# Patient Record
Sex: Female | Born: 1990 | Hispanic: No | Marital: Single | Smoking: Never smoker
Health system: Southern US, Community
[De-identification: ages and names within clinical notes are randomized; demographics above are authoritative.]

## PROBLEM LIST (undated history)

## (undated) DIAGNOSIS — M5430 Sciatica, unspecified side: Secondary | ICD-10-CM

## (undated) DIAGNOSIS — R569 Unspecified convulsions: Secondary | ICD-10-CM

## (undated) DIAGNOSIS — K219 Gastro-esophageal reflux disease without esophagitis: Secondary | ICD-10-CM

## (undated) HISTORY — PX: NO PAST SURGERIES: SHX2092

---

## 2016-02-11 ENCOUNTER — Emergency Department
Admission: EM | Admit: 2016-02-11 | Discharge: 2016-02-11 | Disposition: A | Discharge status: Home / Self Care | Payer: Medicaid Other | Attending: Emergency Medicine | Admitting: Emergency Medicine

## 2016-02-11 ENCOUNTER — Emergency Department: Payer: Medicaid Other

## 2016-02-11 ENCOUNTER — Encounter: Payer: Self-pay | Admitting: Emergency Medicine

## 2016-02-11 DIAGNOSIS — M25552 Pain in left hip: Secondary | ICD-10-CM | POA: Diagnosis not present

## 2016-02-11 DIAGNOSIS — M545 Low back pain: Secondary | ICD-10-CM | POA: Diagnosis not present

## 2016-02-11 DIAGNOSIS — Y92410 Unspecified street and highway as the place of occurrence of the external cause: Secondary | ICD-10-CM | POA: Insufficient documentation

## 2016-02-11 DIAGNOSIS — Y999 Unspecified external cause status: Secondary | ICD-10-CM | POA: Insufficient documentation

## 2016-02-11 DIAGNOSIS — Y9389 Activity, other specified: Secondary | ICD-10-CM | POA: Insufficient documentation

## 2016-02-11 DIAGNOSIS — M25551 Pain in right hip: Secondary | ICD-10-CM | POA: Diagnosis not present

## 2016-02-11 DIAGNOSIS — R102 Pelvic and perineal pain: Secondary | ICD-10-CM | POA: Diagnosis not present

## 2016-02-11 LAB — POCT PREGNANCY, URINE: Preg Test, Ur: NEGATIVE

## 2016-02-11 MED ORDER — ORPHENADRINE CITRATE 30 MG/ML IJ SOLN
60.0000 mg | Freq: Two times a day (BID) | INTRAMUSCULAR | Status: DC
  Administered 2016-02-11: 60 mg via INTRAMUSCULAR
  Filled 2016-02-11: qty 2

## 2016-02-11 MED ORDER — METHOCARBAMOL 750 MG PO TABS: 750.0000 mg | ORAL_TABLET | Freq: Four times a day (QID) | ORAL | 0 refills | Status: AC

## 2016-02-11 MED ORDER — KETOROLAC TROMETHAMINE 30 MG/ML IJ SOLN
30.0000 mg | Freq: Once | INTRAMUSCULAR | Status: AC
  Administered 2016-02-11: 30 mg via INTRAVENOUS
  Filled 2016-02-11: qty 1

## 2016-02-11 MED ORDER — HYDROMORPHONE HCL 1 MG/ML IJ SOLN
1.0000 mg | Freq: Once | INTRAMUSCULAR | Status: AC
  Administered 2016-02-11: 1 mg via INTRAMUSCULAR
  Filled 2016-02-11: qty 1

## 2016-02-11 MED ORDER — TRAMADOL HCL 50 MG PO TABS: 50.0000 mg | ORAL_TABLET | Freq: Four times a day (QID) | ORAL | 0 refills | Status: DC | PRN

## 2016-02-11 MED ORDER — IBUPROFEN 600 MG PO TABS: 600.0000 mg | ORAL_TABLET | Freq: Three times a day (TID) | ORAL | 0 refills | Status: DC | PRN

## 2016-02-11 NOTE — ED Notes (Signed)
Back from x-ray  Family at bedside  
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
## 2016-11-25 ENCOUNTER — Other Ambulatory Visit: Payer: Self-pay | Admitting: Obstetrics & Gynecology

## 2016-11-25 ENCOUNTER — Ambulatory Visit
Admission: RE | Admit: 2016-11-25 | Discharge: 2016-11-25 | Disposition: A | Discharge status: Home / Self Care | Payer: Medicaid Other | Source: Ambulatory Visit | Attending: Obstetrics & Gynecology | Admitting: Obstetrics & Gynecology

## 2016-11-25 DIAGNOSIS — D271 Benign neoplasm of left ovary: Secondary | ICD-10-CM | POA: Insufficient documentation

## 2016-11-25 DIAGNOSIS — R1900 Intra-abdominal and pelvic swelling, mass and lump, unspecified site: Secondary | ICD-10-CM

## 2016-11-25 MED ORDER — IOPAMIDOL (ISOVUE-300) INJECTION 61%
100.0000 mL | Freq: Once | INTRAVENOUS | Status: AC | PRN
  Administered 2016-11-25: 100 mL via INTRAVENOUS

## 2016-12-16 ENCOUNTER — Inpatient Hospital Stay
Admission: RE | Admit: 2016-12-16 | Discharge: 2016-12-16 | Disposition: A | Discharge status: Home / Self Care | Payer: Medicaid Other | Source: Ambulatory Visit

## 2016-12-17 ENCOUNTER — Inpatient Hospital Stay: Admission: RE | Admit: 2016-12-17 | Payer: Medicaid Other | Source: Ambulatory Visit

## 2016-12-18 ENCOUNTER — Encounter: Payer: Self-pay | Admitting: *Deleted

## 2016-12-18 ENCOUNTER — Encounter
Admission: RE | Admit: 2016-12-18 | Discharge: 2016-12-18 | Disposition: A | Discharge status: Home / Self Care | Payer: Medicaid Other | Source: Ambulatory Visit | Attending: Obstetrics & Gynecology | Admitting: Obstetrics & Gynecology

## 2016-12-18 ENCOUNTER — Other Ambulatory Visit: Payer: Self-pay

## 2016-12-18 HISTORY — DX: Gastro-esophageal reflux disease without esophagitis: K21.9

## 2016-12-18 HISTORY — DX: Sciatica, unspecified side: M54.30

## 2016-12-18 HISTORY — DX: Unspecified convulsions: R56.9

## 2016-12-18 NOTE — Patient Instructions (Signed)
  Your procedure is scheduled on: 12-26-16 FRIDAY Report to Same Day Surgery 2nd floor medical mall [facility] Entrance-take elevator on left to 2nd floor.  Check in with surgery information desk.) To find out your arrival time please call [phone] between 1PM - 3PM on 12-25-16 THURSDAY  Remember: Instructions that are not followed completely may result in serious medical risk, up to and including death, or upon the discretion of your surgeon and anesthesiologist your surgery may need to be rescheduled.    _x___ 1. Do not eat food after midnight the night before your procedure. NO GUM OR CANDY AFTER MIDNIGHT.  You may drink clear liquids up to 2 hours before you are scheduled to arrive at the hospital for your procedure.  Do not drink clear liquids within 2 hours of your scheduled arrival to the hospital.  Clear liquids include  --Water or Apple juice without pulp  --Clear carbohydrate beverage such as ClearFast or Gatorade  --Black Coffee or Clear Tea (No milk, no creamers, do not add anything to the coffee or Tea     __x__ 2. No Alcohol for 24 hours before or after surgery.   __x__3. No Smoking for 24 prior to surgery.   ____  4. Bring all medications with you on the day of surgery if instructed.    __x__ 5. Notify your doctor if there is any change in your medical condition     (cold, fever, infections).     Do not wear jewelry, make-up, hairpins, clips or nail polish.  Do not wear lotions, powders, or perfumes. You may wear deodorant.  Do not shave 48 hours prior to surgery. Men may shave face and neck.  Do not bring valuables to the hospital.    [facility] is not responsible for any belongings or valuables.               Contacts, dentures or bridgework may not be worn into surgery.  Leave your suitcase in the car. After surgery it may be brought to your room.  For patients admitted to the hospital, discharge time is determined by your treatment team.   Patients  discharged the day of surgery will not be allowed to drive home.  You will need someone to drive you home and stay with you the night of your procedure.    ____ Take anti-hypertensive listed below, cardiac, seizure, asthma,  anti-reflux and psychiatric medicines. These include:  1. NONE  2.  3.  4.  5.  6.  ____Fleets enema or Magnesium Citrate as directed.   _x___ Use CHG Soap or sage wipes as directed on instruction sheet   ____ Use inhalers on the day of surgery and bring to hospital day of surgery  ____ Stop Metformin and Janumet 2 days prior to surgery.    ____ Take 1/2 of usual insulin dose the night before surgery and none on the morning surgery.   ____ Follow recommendations from Cardiologist, Pulmonologist or PCP regarding stopping Aspirin, Coumadin, Plavix ,Eliquis, Effient, or Pradaxa, and Pletal.  X____Stop Anti-inflammatories such as Advil, Aleve, IBUPROFEN, Motrin, Naproxen, Naprosyn, Goodies powders or aspirin products NOW- OK to take Tylenol    ____ Stop supplements until after surgery.     ____ Bring C-Pap to the hospital.

## 2016-12-22 ENCOUNTER — Encounter
Admission: RE | Admit: 2016-12-22 | Discharge: 2016-12-22 | Disposition: A | Discharge status: Home / Self Care | Payer: Medicaid Other | Source: Ambulatory Visit | Attending: Obstetrics & Gynecology | Admitting: Obstetrics & Gynecology

## 2016-12-22 DIAGNOSIS — Z01812 Encounter for preprocedural laboratory examination: Secondary | ICD-10-CM | POA: Insufficient documentation

## 2016-12-22 LAB — TYPE AND SCREEN
ABO/RH(D): A POS
ANTIBODY SCREEN: NEGATIVE

## 2016-12-22 LAB — CBC
HEMATOCRIT: 39.4 % (ref 35.0–47.0)
HEMOGLOBIN: 13.3 g/dL (ref 12.0–16.0)
MCH: 30.4 pg (ref 26.0–34.0)
MCHC: 33.7 g/dL (ref 32.0–36.0)
MCV: 90.3 fL (ref 80.0–100.0)
Platelets: 160 10*3/uL (ref 150–440)
RBC: 4.36 MIL/uL (ref 3.80–5.20)
RDW: 13.1 % (ref 11.5–14.5)
WBC: 7.2 10*3/uL (ref 3.6–11.0)

## 2016-12-22 LAB — BASIC METABOLIC PANEL
ANION GAP: 6 (ref 5–15)
BUN: 15 mg/dL (ref 6–20)
CO2: 27 mmol/L (ref 22–32)
Calcium: 9 mg/dL (ref 8.9–10.3)
Chloride: 105 mmol/L (ref 101–111)
Creatinine, Ser: 0.74 mg/dL (ref 0.44–1.00)
GFR calc Af Amer: >60 mL/min (ref >60)
Glucose, Bld: 86 mg/dL (ref 65–99)
POTASSIUM: 3.4 mmol/L — AB (ref 3.5–5.1)
SODIUM: 138 mmol/L (ref 135–145)

## 2016-12-22 NOTE — Patient Instructions (Addendum)
  Your procedure is scheduled XQ:JJHERD Dec. 21 , 2018. Report to Same Day Surgery. To find out your arrival time please call [phone] between 1PM - 3PM on Thursday Dec. 20th, 2018.  Remember: Instructions that are not followed completely may result in serious medical risk, up to and including death, or upon the discretion of your surgeon and anesthesiologist your surgery may need to be rescheduled.    _x___ 1. Do not eat food after midnight night prior to surgery.     No gum chewing or hard candies, snacks or breakfast.     May drink the following:      water      Gatorade     clear apple juice      black coffee      or black tea      ____ 2. No Alcohol for 24 hours before or after surgery.   ____ 3. Bring all medications with you on the day of surgery if instructed.    __x__ 4. Notify your doctor if there is any change in your medical condition     (cold, fever, infections).    _____ 5.   Do Not Smoke or use e-cigarettes For 24 Hours Prior to Your   Surgery.  Do not use any chewable tobacco products for at least 6   hours prior to  surgery.                  Do not wear jewelry, make-up, hairpins, clips or nail polish.  Do not wear lotions, powders, or perfumes.   Do not shave 48 hours prior to surgery. Men may shave face and neck.  Do not bring valuables to the hospital.    [facility] is not responsible for any belongings or valuables.               Contacts, dentures or bridgework may not be worn into surgery.  Leave your suitcase in the car. After surgery it may be brought to your room.  For patients admitted to the hospital, discharge time is determined by your  treatment team.   Patients discharged the day of surgery will not be allowed to drive home.    Please read over the following fact sheets that you were given:   [facility] Preparing for Surgery             ____ Take these medicines the morning of surgery with A SIP OF WATER:  NONE       ____ Fleet Enema (as directed)   __x__ Use CHG Soap as directed on instruction sheet  ____ Use inhalers on the day of surgery and bring to hospital day of surgery  ____ Stop metformin 2 days prior to surgery    ____ Take 1/2 of usual insulin dose the night before surgery and none on the morning of          surgery.   ____ Stop Eliquis/Coumadin/Plavix/aspirin on does not apply.  __x_ Stop Anti-inflammatories such as Advil, Aleve, Ibuprofen, Motrin, Naproxen,  Naprosyn, Goodies powders or aspirin products. OK to take Tylenol.   ____ Stop supplements until after surgery.    ____ Bring C-Pap to the hospital.

## 2016-12-25 ENCOUNTER — Encounter: Payer: Self-pay | Admitting: *Deleted

## 2016-12-26 ENCOUNTER — Ambulatory Visit
Admission: RE | Admit: 2016-12-26 | Discharge: 2016-12-26 | Disposition: A | Discharge status: Home / Self Care | Payer: Medicaid Other | Source: Ambulatory Visit | Attending: Obstetrics & Gynecology | Admitting: Obstetrics & Gynecology

## 2016-12-26 ENCOUNTER — Ambulatory Visit: Payer: Medicaid Other | Admitting: Anesthesiology

## 2016-12-26 ENCOUNTER — Encounter
Admission: RE | Disposition: A | Discharge status: Home / Self Care | Payer: Self-pay | Source: Ambulatory Visit | Attending: Obstetrics & Gynecology

## 2016-12-26 DIAGNOSIS — K219 Gastro-esophageal reflux disease without esophagitis: Secondary | ICD-10-CM | POA: Insufficient documentation

## 2016-12-26 DIAGNOSIS — Z79899 Other long term (current) drug therapy: Secondary | ICD-10-CM | POA: Insufficient documentation

## 2016-12-26 DIAGNOSIS — N83202 Unspecified ovarian cyst, left side: Secondary | ICD-10-CM | POA: Diagnosis present

## 2016-12-26 DIAGNOSIS — D271 Benign neoplasm of left ovary: Secondary | ICD-10-CM | POA: Insufficient documentation

## 2016-12-26 LAB — URINE DRUG SCREEN, QUALITATIVE (ARMC ONLY)
AMPHETAMINES, UR SCREEN: NOT DETECTED
BENZODIAZEPINE, UR SCRN: NOT DETECTED
Barbiturates, Ur Screen: NOT DETECTED
CANNABINOID 50 NG, UR ~~LOC~~: POSITIVE — AB
Cocaine Metabolite,Ur ~~LOC~~: NOT DETECTED
MDMA (Ecstasy)Ur Screen: NOT DETECTED
Methadone Scn, Ur: NOT DETECTED
OPIATE, UR SCREEN: NOT DETECTED
PHENCYCLIDINE (PCP) UR S: NOT DETECTED
Tricyclic, Ur Screen: NOT DETECTED

## 2016-12-26 LAB — POCT PREGNANCY, URINE: Preg Test, Ur: NEGATIVE

## 2016-12-26 SURGERY — OOPHORECTOMY, LAPAROSCOPIC
Anesthesia: General | Laterality: Left | Wound class: Clean

## 2016-12-26 MED ORDER — FENTANYL CITRATE (PF) 100 MCG/2ML IJ SOLN
INTRAMUSCULAR | Status: DC | PRN
  Administered 2016-12-26: 100 ug via INTRAVENOUS
  Administered 2016-12-26: 150 ug via INTRAVENOUS

## 2016-12-26 MED ORDER — FAMOTIDINE 20 MG PO TABS
ORAL_TABLET | ORAL | Status: AC
  Administered 2016-12-26: 20 mg via ORAL
  Filled 2016-12-26: qty 1

## 2016-12-26 MED ORDER — LACTATED RINGERS IV SOLN
INTRAVENOUS | Status: DC
  Administered 2016-12-26 (×2): via INTRAVENOUS

## 2016-12-26 MED ORDER — OXYCODONE HCL 5 MG PO TABS: 5.0000 mg | ORAL_TABLET | ORAL | 0 refills | Status: AC | PRN

## 2016-12-26 MED ORDER — FENTANYL CITRATE (PF) 100 MCG/2ML IJ SOLN: 25.0000 ug | INTRAMUSCULAR | Status: DC | PRN

## 2016-12-26 MED ORDER — FENTANYL CITRATE (PF) 250 MCG/5ML IJ SOLN
INTRAMUSCULAR | Status: AC
  Filled 2016-12-26: qty 5

## 2016-12-26 MED ORDER — SUCCINYLCHOLINE CHLORIDE 20 MG/ML IJ SOLN
INTRAMUSCULAR | Status: AC
  Filled 2016-12-26: qty 1

## 2016-12-26 MED ORDER — OXYCODONE HCL 5 MG PO TABS
5.0000 mg | ORAL_TABLET | Freq: Once | ORAL | Status: AC | PRN
  Administered 2016-12-26: 5 mg via ORAL

## 2016-12-26 MED ORDER — ROCURONIUM BROMIDE 100 MG/10ML IV SOLN
INTRAVENOUS | Status: DC | PRN
  Administered 2016-12-26: 40 mg via INTRAVENOUS

## 2016-12-26 MED ORDER — OXYCODONE HCL 5 MG/5ML PO SOLN: 5.0000 mg | Freq: Once | ORAL | Status: AC | PRN

## 2016-12-26 MED ORDER — CELECOXIB 200 MG PO CAPS
ORAL_CAPSULE | ORAL | Status: AC
  Administered 2016-12-26: 400 mg via ORAL
  Filled 2016-12-26: qty 2

## 2016-12-26 MED ORDER — KETOROLAC TROMETHAMINE 30 MG/ML IJ SOLN
30.0000 mg | Freq: Once | INTRAMUSCULAR | Status: AC
  Administered 2016-12-26: 30 mg via INTRAVENOUS

## 2016-12-26 MED ORDER — CELECOXIB 200 MG PO CAPS
400.0000 mg | ORAL_CAPSULE | Freq: Once | ORAL | Status: AC
  Administered 2016-12-26: 400 mg via ORAL

## 2016-12-26 MED ORDER — ACETAMINOPHEN 500 MG PO TABS
ORAL_TABLET | ORAL | Status: AC
  Administered 2016-12-26: 1000 mg via ORAL
  Filled 2016-12-26: qty 2

## 2016-12-26 MED ORDER — FAMOTIDINE 20 MG PO TABS
20.0000 mg | ORAL_TABLET | Freq: Once | ORAL | Status: AC
  Administered 2016-12-26: 20 mg via ORAL

## 2016-12-26 MED ORDER — ACETAMINOPHEN 500 MG PO TABS
1000.0000 mg | ORAL_TABLET | Freq: Once | ORAL | Status: AC
  Administered 2016-12-26: 1000 mg via ORAL

## 2016-12-26 MED ORDER — DEXAMETHASONE SODIUM PHOSPHATE 10 MG/ML IJ SOLN
INTRAMUSCULAR | Status: DC | PRN
  Administered 2016-12-26: 10 mg via INTRAVENOUS

## 2016-12-26 MED ORDER — GABAPENTIN 600 MG PO TABS
600.0000 mg | ORAL_TABLET | Freq: Once | ORAL | Status: DC
  Filled 2016-12-26: qty 1

## 2016-12-26 MED ORDER — MIDAZOLAM HCL 2 MG/2ML IJ SOLN
INTRAMUSCULAR | Status: DC | PRN
  Administered 2016-12-26: 2 mg via INTRAVENOUS

## 2016-12-26 MED ORDER — PHENYLEPHRINE HCL 10 MG/ML IJ SOLN
INTRAMUSCULAR | Status: DC | PRN
  Administered 2016-12-26: 80 ug via INTRAVENOUS

## 2016-12-26 MED ORDER — HEPARIN SODIUM (PORCINE) 5000 UNIT/ML IJ SOLN
5000.0000 [IU] | Freq: Once | INTRAMUSCULAR | Status: AC
  Administered 2016-12-26: 5000 [IU] via SUBCUTANEOUS

## 2016-12-26 MED ORDER — MIDAZOLAM HCL 2 MG/2ML IJ SOLN
INTRAMUSCULAR | Status: AC
  Filled 2016-12-26: qty 2

## 2016-12-26 MED ORDER — OXYCODONE HCL 5 MG PO TABS
ORAL_TABLET | ORAL | Status: AC
  Administered 2016-12-26: 5 mg via ORAL
  Filled 2016-12-26: qty 1

## 2016-12-26 MED ORDER — ONDANSETRON HCL 4 MG/2ML IJ SOLN
INTRAMUSCULAR | Status: DC | PRN
  Administered 2016-12-26: 4 mg via INTRAVENOUS

## 2016-12-26 MED ORDER — HEPARIN SODIUM (PORCINE) 5000 UNIT/ML IJ SOLN
INTRAMUSCULAR | Status: AC
  Administered 2016-12-26: 5000 [IU] via SUBCUTANEOUS
  Filled 2016-12-26: qty 1

## 2016-12-26 MED ORDER — BUPIVACAINE LIPOSOME 1.3 % IJ SUSP
INTRAMUSCULAR | Status: AC
  Filled 2016-12-26: qty 20

## 2016-12-26 MED ORDER — PROPOFOL 10 MG/ML IV BOLUS
INTRAVENOUS | Status: AC
  Filled 2016-12-26: qty 20

## 2016-12-26 MED ORDER — DEXAMETHASONE SODIUM PHOSPHATE 10 MG/ML IJ SOLN
INTRAMUSCULAR | Status: AC
  Filled 2016-12-26: qty 1

## 2016-12-26 MED ORDER — SUGAMMADEX SODIUM 200 MG/2ML IV SOLN
INTRAVENOUS | Status: AC
Start: 2016-12-26 — End: ?
  Filled 2016-12-26: qty 2

## 2016-12-26 MED ORDER — IBUPROFEN 800 MG PO TABS: 800.0000 mg | ORAL_TABLET | Freq: Four times a day (QID) | ORAL | 1 refills | Status: AC | PRN

## 2016-12-26 MED ORDER — SEVOFLURANE IN SOLN
RESPIRATORY_TRACT | Status: AC
  Filled 2016-12-26: qty 250

## 2016-12-26 MED ORDER — BUPIVACAINE LIPOSOME 1.3 % IJ SUSP
INTRAMUSCULAR | Status: DC | PRN
  Administered 2016-12-26: 20 mL

## 2016-12-26 MED ORDER — ROCURONIUM BROMIDE 50 MG/5ML IV SOLN
INTRAVENOUS | Status: AC
  Filled 2016-12-26: qty 1

## 2016-12-26 MED ORDER — ONDANSETRON HCL 4 MG/2ML IJ SOLN
INTRAMUSCULAR | Status: AC
  Filled 2016-12-26: qty 2

## 2016-12-26 MED ORDER — GABAPENTIN 300 MG PO CAPS
ORAL_CAPSULE | ORAL | Status: AC
  Administered 2016-12-26: 600 mg
  Filled 2016-12-26: qty 2

## 2016-12-26 MED ORDER — LIDOCAINE HCL (PF) 2 % IJ SOLN
INTRAMUSCULAR | Status: AC
  Filled 2016-12-26: qty 10

## 2016-12-26 MED ORDER — KETOROLAC TROMETHAMINE 30 MG/ML IJ SOLN
INTRAMUSCULAR | Status: AC
  Administered 2016-12-26: 30 mg via INTRAVENOUS
  Filled 2016-12-26: qty 1

## 2016-12-26 MED ORDER — SUGAMMADEX SODIUM 200 MG/2ML IV SOLN
INTRAVENOUS | Status: DC | PRN
  Administered 2016-12-26: 200 mg via INTRAVENOUS

## 2016-12-26 MED ORDER — LIDOCAINE 2% (20 MG/ML) 5 ML SYRINGE
INTRAMUSCULAR | Status: DC | PRN
  Administered 2016-12-26: 100 mg via INTRAVENOUS

## 2016-12-26 MED ORDER — DEXMEDETOMIDINE HCL 200 MCG/2ML IV SOLN
INTRAVENOUS | Status: DC | PRN
  Administered 2016-12-26: 12 ug via INTRAVENOUS

## 2016-12-26 MED ORDER — PROPOFOL 10 MG/ML IV BOLUS
INTRAVENOUS | Status: DC | PRN
  Administered 2016-12-26: 200 mg via INTRAVENOUS

## 2016-12-26 SURGICAL SUPPLY — 39 items
BAG URINE DRAINAGE (UROLOGICAL SUPPLIES) ×3 IMPLANT
BLADE SURG SZ11 CARB STEEL (BLADE) ×3 IMPLANT
CANISTER SUCT 1200ML W/VALVE (MISCELLANEOUS) ×3 IMPLANT
CATH FOLEY 2WAY  5CC 16FR (CATHETERS) ×2
CATH URTH 16FR FL 2W BLN LF (CATHETERS) ×1 IMPLANT
CHLORAPREP W/TINT 26ML (MISCELLANEOUS) ×3 IMPLANT
DERMABOND ADVANCED (GAUZE/BANDAGES/DRESSINGS) ×2
DERMABOND ADVANCED .7 DNX12 (GAUZE/BANDAGES/DRESSINGS) ×1 IMPLANT
DRAPE LEGGINS SURG 28X43 STRL (DRAPES) ×3 IMPLANT
DRAPE UNDER BUTTOCK W/FLU (DRAPES) ×3 IMPLANT
GLOVE PI ORTHOPRO 6.5 (GLOVE) ×2
GLOVE PI ORTHOPRO STRL 6.5 (GLOVE) ×1 IMPLANT
GLOVE SURG SYN 6.5 ES PF (GLOVE) ×3 IMPLANT
GOWN STRL REUS W/ TWL LRG LVL3 (GOWN DISPOSABLE) ×2 IMPLANT
GOWN STRL REUS W/TWL LRG LVL3 (GOWN DISPOSABLE) ×4
GRASPER SUT TROCAR 14GX15 (MISCELLANEOUS) ×3 IMPLANT
IRRIGATION STRYKERFLOW (MISCELLANEOUS) ×1 IMPLANT
IRRIGATOR STRYKERFLOW (MISCELLANEOUS) ×3
IV LACTATED RINGERS 1000ML (IV SOLUTION) ×3 IMPLANT
KIT PINK PAD W/HEAD ARE REST (MISCELLANEOUS) ×3
KIT PINK PAD W/HEAD ARM REST (MISCELLANEOUS) ×1 IMPLANT
KIT RM TURNOVER CYSTO AR (KITS) ×3 IMPLANT
LABEL OR SOLS (LABEL) IMPLANT
LIGASURE VESSEL 5MM BLUNT TIP (ELECTROSURGICAL) ×3 IMPLANT
MANIPULATOR UTERINE 4.5 ZUMI (MISCELLANEOUS) IMPLANT
NEEDLE HYPO 22GX1.5 SAFETY (NEEDLE) ×3 IMPLANT
NS IRRIG 500ML POUR BTL (IV SOLUTION) ×3 IMPLANT
PACK LAP CHOLECYSTECTOMY (MISCELLANEOUS) ×3 IMPLANT
PAD OB MATERNITY 4.3X12.25 (PERSONAL CARE ITEMS) ×3 IMPLANT
PAD PREP 24X41 OB/GYN DISP (PERSONAL CARE ITEMS) ×3 IMPLANT
POUCH SPECIMEN RETRIEVAL 10MM (ENDOMECHANICALS) ×6 IMPLANT
SCISSORS METZENBAUM CVD 33 (INSTRUMENTS) IMPLANT
SLEEVE ENDOPATH XCEL 5M (ENDOMECHANICALS) ×3 IMPLANT
SUT MNCRL AB 4-0 PS2 18 (SUTURE) ×3 IMPLANT
SUT VIC AB 2-0 UR6 27 (SUTURE) ×3 IMPLANT
SYR 10ML LL (SYRINGE) ×3 IMPLANT
TROCAR ENDO BLADELESS 11MM (ENDOMECHANICALS) ×3 IMPLANT
TROCAR XCEL NON-BLD 5MMX100MML (ENDOMECHANICALS) ×3 IMPLANT
TUBING INSUFFLATION (TUBING) ×3 IMPLANT
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
## 2016-12-26 NOTE — Discharge Instructions (Addendum)
Discharge instructions:   Call office if you have any of the following: fever >101 F, chills, excessive vaginal bleeding, incision drainage or problems, leg pain or redness, or any other concerns.     Activity: Do not lift > 10 lbs for 8 weeks.  No driving for 1-2 weeks.     You may feel some pain in your upper right abdomen/rib and right shoulder.  This is from the gas in the abdomen for surgery. This will subside over time, please be patient!    Take 600mg  Ibuprofen and 1000mg  Tylenol around the clock, every 6 hours for at least the first 3-5 days.  After this you can take as needed.  This will help decrease inflammation and promote healing.  The narcotics you'll take just as needed, as they just trick your brain into thinking its not in pain.      Please don't limit yourself in terms of routine activity.  You will be able to do most things, although they may take longer to do or be a little painful.  You can do it!    Don't be a hero, but don't be a wimp either!             AMBULATORY SURGERY  DISCHARGE INSTRUCTIONS   1) The drugs that you were given will stay in your system until tomorrow so for the next 24 hours you should not:  A) Drive an automobile B) Make any legal decisions C) Drink any alcoholic beverage   2) You may resume regular meals tomorrow.  Today it is better to start with liquids and gradually work up to solid foods.  You may eat anything you prefer, but it is better to start with liquids, then soup and crackers, and gradually work up to solid foods.   3) Please notify your doctor immediately if you have any unusual bleeding, trouble breathing, redness and pain at the surgery site, drainage, fever, or pain not relieved by medication.    4) Additional Instructions: TAKE A STOOL SOFTENER TWICE A DAY WHILE TAKING NARCOTIC PAIN MEDICINE TO PREVENT CONSTIPATION   Please contact your physician with any problems or Same Day Surgery at  [phone], Monday through Friday 6 am to 4 pm, or [place] [facility] number at [phone].      CIRUGIA AMBULATORIA       Instruccionnes de alta    Date (Fecha)    1.  Las drogas que se Statistician en su cuerpo [employer], asi      que por las proximas 24 horas usted no debe:   Conducir Scientist, research (medical)) un automovil   Hacer ninguna decision legal   Tomar ninguna bebida alcoholica  2.  A) Manana puede comenzar una dieta regular.  Es mejor que hoy empiece con                    liquidos y gradualmente anada comidas solidas.       B) Puede comer cualquier comida que desee pero es mejor empezar con liquidos,               luego sopitas con galletas saladas y gradualmente llegar a las comidas solidas.   3.  Por favor avise a su medico inmediatamente si usted tiene algun sangrado anormal,       tiene dificultad con la respiracion, enrojecimiento y Social research officer, government en el sitio de la cirugia,     [place], fiebro o dolor que se [place] con  medicina.   4.  A) Su visita posoperatoria (despues de su operacion) es con el                           B)  Por favor llame para hacer la cita posoperatoria.  5.  Istrucciones especificas :

## 2016-12-26 NOTE — Anesthesia Post-op Follow-up Note (Signed)
Anesthesia QCDR form completed.        

## 2016-12-26 NOTE — Anesthesia Postprocedure Evaluation (Signed)
Anesthesia Post Note  Patient: Olivia Kennedy  Procedure(s) Performed: LAPAROSCOPIC OOPHORECTOMY WITH A MINI LAP (Left )  Patient location during evaluation: PACU Anesthesia Type: General Level of consciousness: awake and alert Pain management: pain level controlled Vital Signs Assessment: post-procedure vital signs reviewed and stable Respiratory status: spontaneous breathing, nonlabored ventilation, respiratory function stable and patient connected to nasal cannula oxygen Cardiovascular status: blood pressure returned to baseline and stable Postop Assessment: no apparent nausea or vomiting Anesthetic complications: no     Last Vitals:  Vitals:   12/26/16 1305 12/26/16 1320  BP: 98/83 110/78  Pulse: 75 64  Resp: 20 18  Temp:  36.8 C  SpO2: 93% 100%    Last Pain:  Vitals:   12/26/16 1320  TempSrc: Temporal  PainSc: 10-Worst pain ever                 Precious Haws Piscitello
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
## 2016-12-26 NOTE — Transfer of Care (Signed)
Immediate Anesthesia Transfer of Care Note  Patient: Olivia Kennedy  Procedure(s) Performed: LAPAROSCOPIC OOPHORECTOMY WITH A MINI LAP (Left )  Patient Location: PACU  Anesthesia Type:General  Level of Consciousness: sedated  Airway & Oxygen Therapy: Patient Spontanous Breathing and Patient connected to face mask oxygen  Post-op Assessment: Report given to RN and Post -op Vital signs reviewed and stable  Post vital signs: Reviewed and stable  Last Vitals:  Vitals:   12/26/16 0906 12/26/16 1218  BP: (!) 123/94 112/71  Pulse: 64 67  Resp: 17 11  Temp: (!) 36.2 C (!) 36.3 C  SpO2: 100% 100%    Last Pain:  Vitals:   12/26/16 1218  TempSrc:   PainSc: Asleep      Patients Stated Pain Goal: 2 (86/75/44 9201)  Complications: No apparent anesthesia complications

## 2016-12-26 NOTE — Interval H&P Note (Signed)
History and Physical Interval Note:  12/26/2016 9:53 AM   BP (!) 123/94   Pulse 64   Temp (!) 97.1 F (36.2 C) (Tympanic)   Resp 17   SpO2 100%   Olivia Kennedy  has presented today for surgery, with the diagnosis of Ovarian Cyst  Pelvic Pain  The various methods of treatment have been discussed with the patient and family. After consideration of risks, benefits and other options for treatment, the patient has consented to  Procedure(s): LAPAROSCOPIC OOPHORECTOMY (N/A) as a surgical intervention .  The patient's history has been reviewed, patient examined, no change in status, stable for surgery.  I have reviewed the patient's chart and labs.  Questions were answered to the patient's satisfaction.    Chlamydia was negative on 12/7.   [place]

## 2016-12-26 NOTE — Op Note (Signed)
12/26/2016  PATIENT:  Olivia Kennedy  26 y.o. female  PRE-OPERATIVE DIAGNOSIS:  Ovarian Cyst  Pelvic Pain  POST-OPERATIVE DIAGNOSIS:  Ovarian Cyst,  Pelvic Pain  PROCEDURE:  Procedure(s): LAPAROSCOPIC OOPHORECTOMY (Left) Mini-laparotomy  SURGEON:  Surgeon(s) and Role:    * Ward, Honor Loh, MD - Primary  ANESTHESIA: GET  EBL:  Total I/O In: 600 [I.V.:600] Out: 1050 [Urine:1000; Blood:50]  DRAINS: foley to gravity   SPECIMEN: left tube and ovary  DISPOSITION OF SPECIMEN:  To pathology  COUNTS: correct x2  COMPLICATIONS: none apparent  PATIENT DISPOSITION:  VS stable to PACU   Indication for surgery: Patient had presented with acute pelvic pain, was treated for PID, and found to have a 9cm cyst on the left ovary, consistent with dermoid.  CT scan showed the same.  She was consented for surgical management.   Procedure: The patient was brought to the OR and identified as Olivia Kennedy.  She was given general anesthesia via endotracheal route, positioned in the dorsal lithotomy position and prepped and draped in the usual sterile fashion.  A surgical time-out was called. A foley catheter was placed.  A speculum was placed in the vagina and the cervix was visualized, grasped with a single tooth tenaculum and the uterine sound was used in tandem as a Printmaker.  After a change of gloves, the attention was turned to the abdomen. A periumbilical incision was made, 80mm trochar was placed using the visiport method. The opening pressure was 56mmHg. Pneumoperitoneum was created to 13mmHg.  Brief survey of the abdomen was performed and appeared normal.  A 46mm trochar was placed in the right lower quadrant, and an 43mm on the right, under visualization.    The left ureter was identified, away from the IP ligament.  The IP was thrice cauterized and divided with the Ligasure.  The mesosalpinx was divided stepwise to the cornua, and the utero-ovarian ligament was sealed and divided.   The pneumoperitoneum was deflated then recreated and all operative sites were hemostatic.   The endocatch bag was inserted and the ovary and tube placed within.  The 49mm trochar site was expanded to a mini-laparotomy and the ovary in the bag was removed.   The pneumoperitoneum was deflated and trochars removed.  The fascia of the LLQ was grasped with clamps and reapproximated with 2-0 vicryl in a running stitch.  20cc of Exparel was injected.  The subcutaneous tissue was irrigated, and reapproximated with 3-0 vicryl. The skin incisions were reapproximated with 4-0 monocryl.  The skin was then closed with sugical glue.    The patient tolerated the procedure, the sponge, needle, and instrument counts were correct x2, and the patient was brought to PACU extubated, and in a stable condition.  I was present for, and performed this procedure in its entirety.  ----- Larey Days, MD Attending Obstetrician and Gynecologist [facility], Department of [facility]

## 2016-12-26 NOTE — Anesthesia Procedure Notes (Signed)
Procedure Name: Intubation Date/Time: 12/26/2016 10:46 AM Performed by: Marsh Dolly, CRNA Pre-anesthesia Checklist: Patient identified, Patient being monitored, Timeout performed, Emergency Drugs available and Suction available Patient Re-evaluated:Patient Re-evaluated prior to induction Oxygen Delivery Method: Circle system utilized Preoxygenation: Pre-oxygenation with 100% oxygen Induction Type: IV induction Ventilation: Mask ventilation without difficulty Laryngoscope Size: 3 and Miller Grade View: Grade I Tube type: Oral Tube size: 7.5 mm Number of attempts: 1 Placement Confirmation: ETT inserted through vocal cords under direct vision,  positive ETCO2 and breath sounds checked- equal and bilateral Secured at: 21 cm Tube secured with: Tape Dental Injury: Teeth and Oropharynx as per pre-operative assessment

## 2016-12-27 LAB — ABO/RH: ABO/RH(D): A POS

## 2016-12-29 ENCOUNTER — Other Ambulatory Visit: Payer: Self-pay

## 2016-12-29 ENCOUNTER — Emergency Department: Payer: Medicaid Other

## 2016-12-29 ENCOUNTER — Emergency Department
Admission: EM | Admit: 2016-12-29 | Discharge: 2016-12-29 | Disposition: A | Discharge status: Home / Self Care | Payer: Medicaid Other | Attending: Emergency Medicine | Admitting: Emergency Medicine

## 2016-12-29 ENCOUNTER — Encounter: Payer: Self-pay | Admitting: Emergency Medicine

## 2016-12-29 DIAGNOSIS — R0602 Shortness of breath: Secondary | ICD-10-CM | POA: Diagnosis present

## 2016-12-29 DIAGNOSIS — J40 Bronchitis, not specified as acute or chronic: Secondary | ICD-10-CM | POA: Insufficient documentation

## 2016-12-29 LAB — URINALYSIS, COMPLETE (UACMP) WITH MICROSCOPIC
BILIRUBIN URINE: NEGATIVE
Bacteria, UA: NONE SEEN
Glucose, UA: NEGATIVE mg/dL
Hgb urine dipstick: NEGATIVE
Ketones, ur: 5 mg/dL — AB
Leukocytes, UA: NEGATIVE
NITRITE: NEGATIVE
PH: 7 (ref 5.0–8.0)
Protein, ur: NEGATIVE mg/dL
SPECIFIC GRAVITY, URINE: 1.02 (ref 1.005–1.030)

## 2016-12-29 LAB — BASIC METABOLIC PANEL
ANION GAP: 11 (ref 5–15)
BUN: 15 mg/dL (ref 6–20)
CALCIUM: 9.1 mg/dL (ref 8.9–10.3)
CHLORIDE: 101 mmol/L (ref 101–111)
CO2: 24 mmol/L (ref 22–32)
Creatinine, Ser: 0.8 mg/dL (ref 0.44–1.00)
GFR calc non Af Amer: >60 mL/min (ref >60)
Glucose, Bld: 105 mg/dL — ABNORMAL HIGH (ref 65–99)
POTASSIUM: 3.7 mmol/L (ref 3.5–5.1)
Sodium: 136 mmol/L (ref 135–145)

## 2016-12-29 LAB — CBC
HEMATOCRIT: 40.2 % (ref 35.0–47.0)
HEMOGLOBIN: 13.6 g/dL (ref 12.0–16.0)
MCH: 30.4 pg (ref 26.0–34.0)
MCHC: 33.9 g/dL (ref 32.0–36.0)
MCV: 89.6 fL (ref 80.0–100.0)
Platelets: 189 10*3/uL (ref 150–440)
RBC: 4.48 MIL/uL (ref 3.80–5.20)
RDW: 12.8 % (ref 11.5–14.5)
WBC: 9.1 10*3/uL (ref 3.6–11.0)

## 2016-12-29 LAB — TROPONIN I: Troponin I: <0.03 ng/mL (ref <0.03)

## 2016-12-29 LAB — SURGICAL PATHOLOGY

## 2016-12-29 MED ORDER — PREDNISONE 20 MG PO TABS
60.0000 mg | ORAL_TABLET | Freq: Once | ORAL | Status: AC
  Administered 2016-12-29: 60 mg via ORAL
  Filled 2016-12-29: qty 3

## 2016-12-29 MED ORDER — PREDNISONE 20 MG PO TABS: 60.0000 mg | ORAL_TABLET | Freq: Every day | ORAL | 0 refills | Status: AC

## 2016-12-29 MED ORDER — IOPAMIDOL (ISOVUE-370) INJECTION 76%
75.0000 mL | Freq: Once | INTRAVENOUS | Status: AC | PRN
  Administered 2016-12-29: 75 mL via INTRAVENOUS

## 2016-12-29 MED ORDER — ALBUTEROL SULFATE HFA 108 (90 BASE) MCG/ACT IN AERS: 2.0000 | INHALATION_SPRAY | Freq: Four times a day (QID) | RESPIRATORY_TRACT | 2 refills | Status: AC | PRN

## 2016-12-29 MED ORDER — IPRATROPIUM-ALBUTEROL 0.5-2.5 (3) MG/3ML IN SOLN
3.0000 mL | Freq: Once | RESPIRATORY_TRACT | Status: AC
  Administered 2016-12-29: 3 mL via RESPIRATORY_TRACT
  Filled 2016-12-29: qty 3

## 2016-12-29 NOTE — ED Notes (Signed)
NEGATIVE UPREG RESULT
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
## 2016-12-29 NOTE — ED Notes (Signed)
Pt given cup of water 

## 2016-12-29 NOTE — ED Notes (Signed)
Patient transported to CT 

## 2016-12-29 NOTE — ED Triage Notes (Signed)
First Nurse Note:  Arrives with C/O SOB since Friday.  Patient had a left oophorectomy on Friday and states has been c/o SOB since Surgery.

## 2016-12-29 NOTE — ED Triage Notes (Signed)
Pt reports SOB since oopherectomy on Friday last week. Pt reports lower abdominal pain. Pt reports nausea denies vomiting.

## 2019-04-29 IMAGING — CT CT ANGIO CHEST
2 of 6 series · 17 of 46 positions shown · IV contrast (APPLIED)
Comparison: No prior chest CT.  Chest x-ray earlier same date.

CLINICAL DATA: 26-year-old who underwent left oophorectomy three
days ago due to pain 9 cm left ovarian dermoid cyst, complaining of
shortness of breath since the time of surgery.

EXAM:
CT ANGIOGRAPHY CHEST WITH CONTRAST
TECHNIQUE: Multidetector CT imaging of the chest was performed using the
standard protocol during bolus administration of intravenous
contrast. Multiplanar CT image reconstructions and MIPs were
obtained to evaluate the vascular anatomy.
CONTRAST:  75mL [id number] IOPAMIDOL INJECTION 76% IV.

[Series 5: thins · axial · 0.72mm/px · z∈[-301,-82]mm · 14 of 241 slices shown]
[im 11/241  lung]
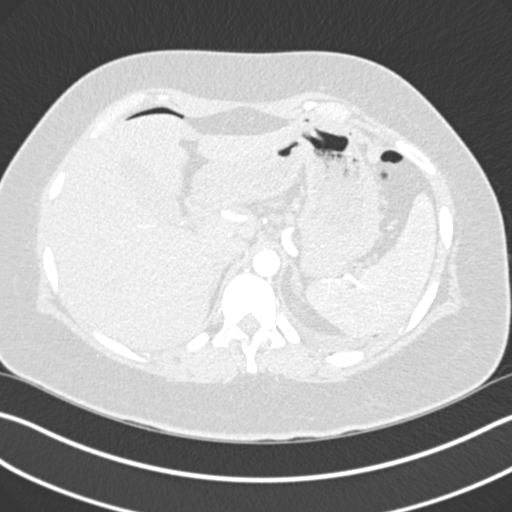
[im 32/241  soft-tissue]
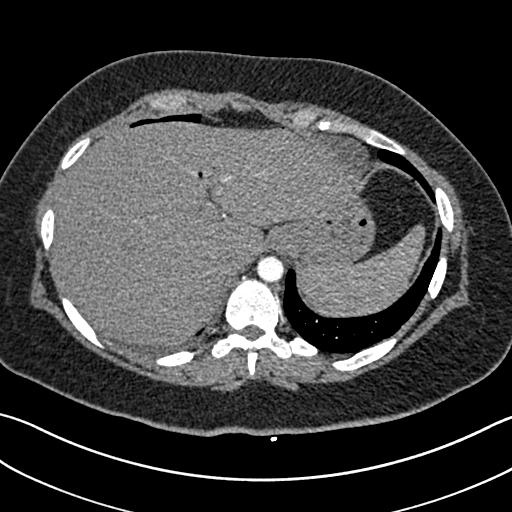
[im 42/241  lung]
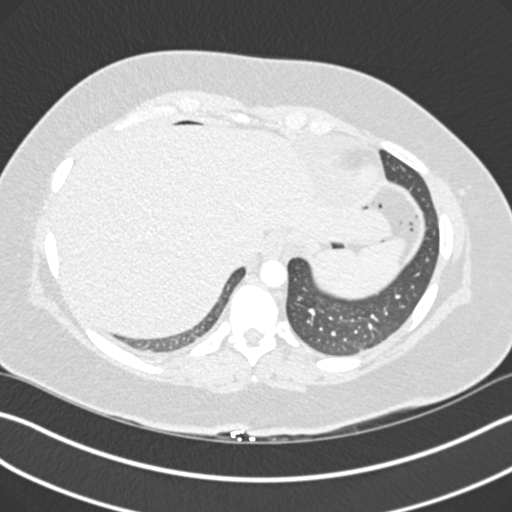
[im 63/241  soft-tissue]
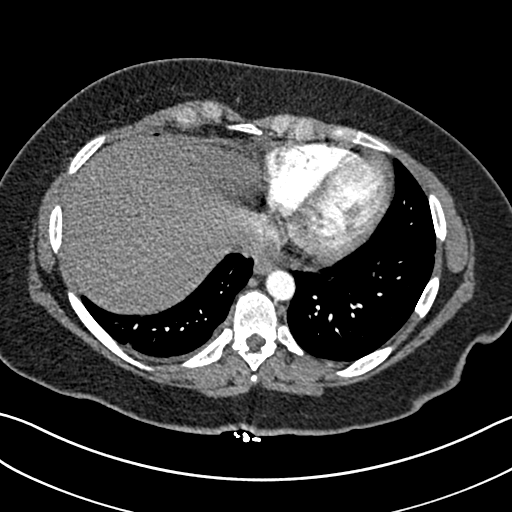
[im 84/241  lung]
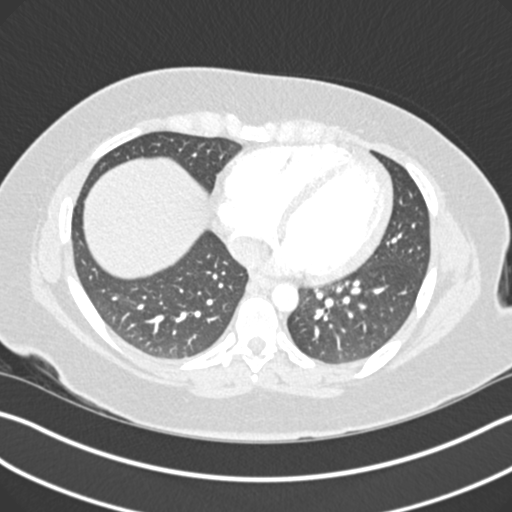
[im 94/241  soft-tissue]
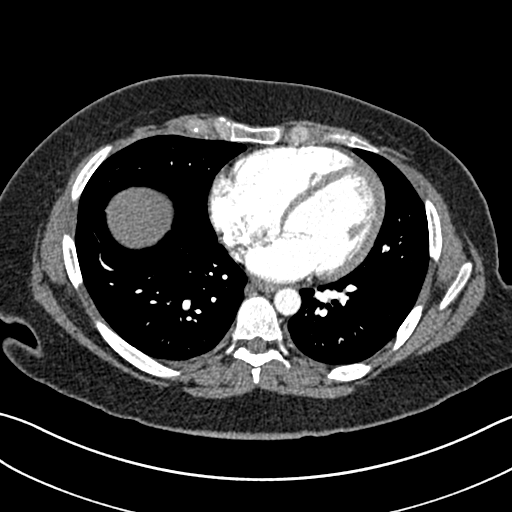
[im 115/241  lung]
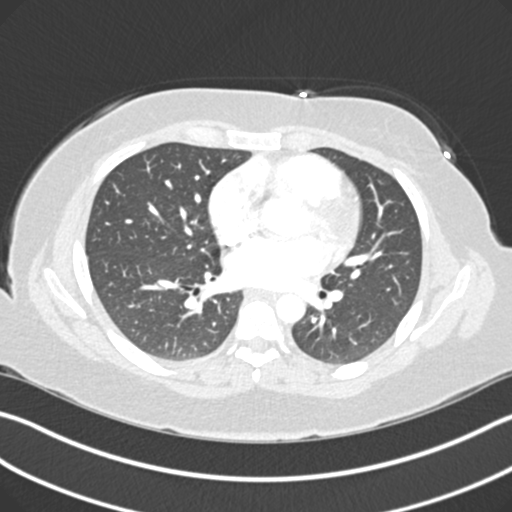
[im 126/241  soft-tissue]
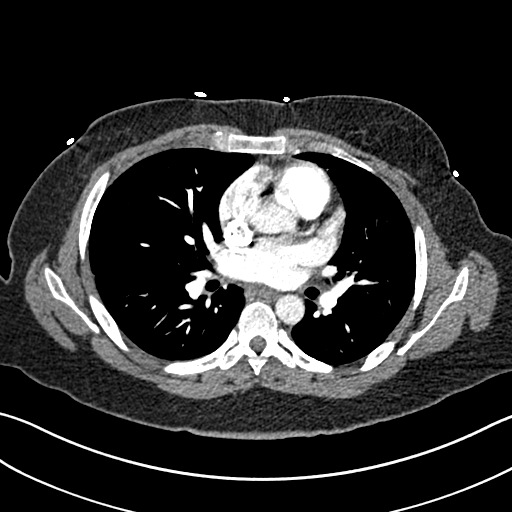
[im 147/241  lung]
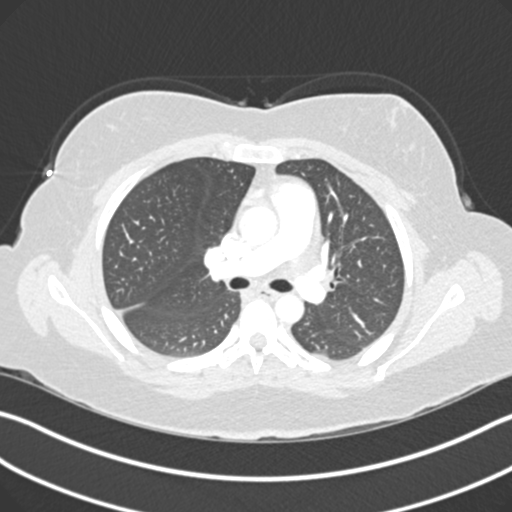
[im 157/241  soft-tissue]
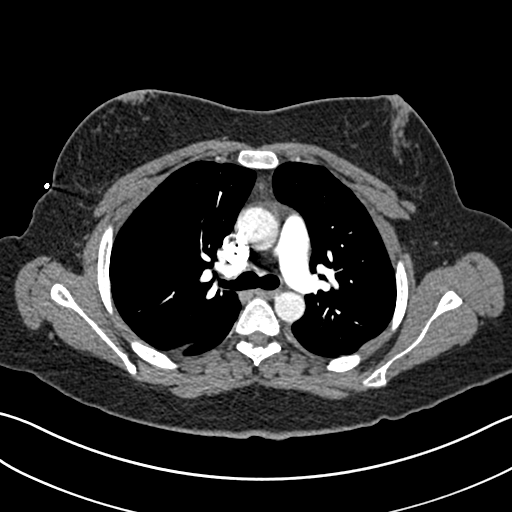
[im 178/241  lung]
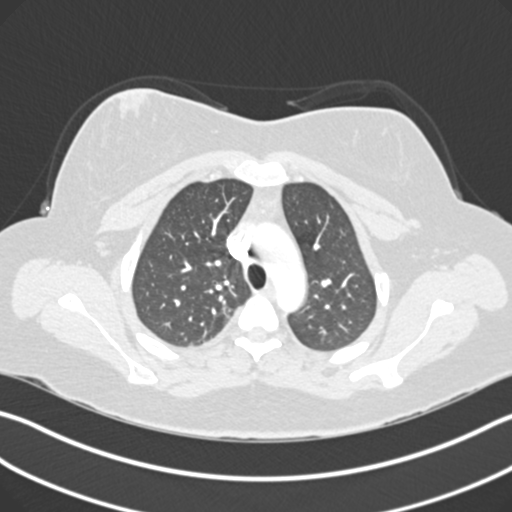
[im 199/241  soft-tissue]
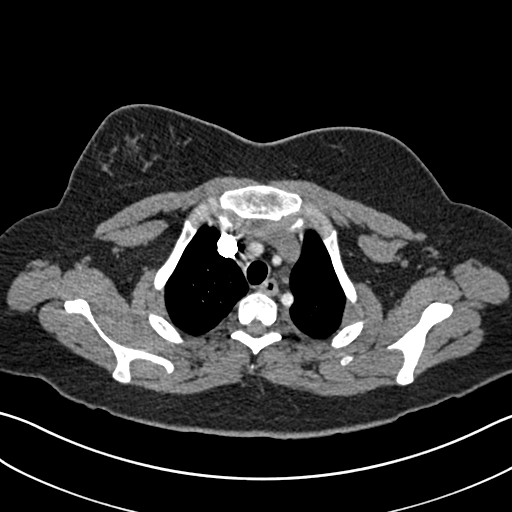
[im 209/241  lung]
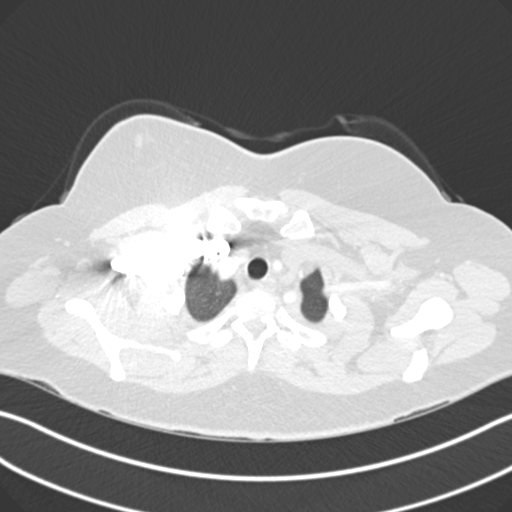
[im 230/241  soft-tissue]
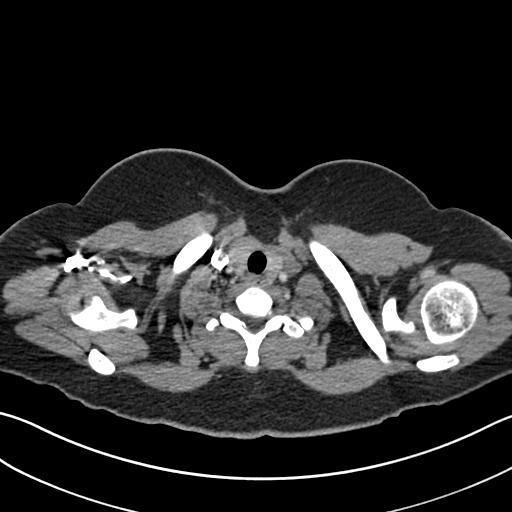

[Series 7: coronal mpr · coronal · 0.49mm/px · 3 of 85 slices shown]
[im 22/85  soft-tissue]
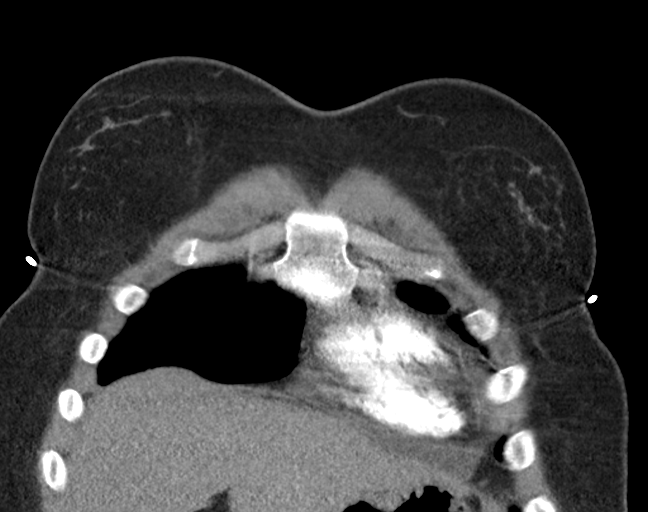
[im 43/85  soft-tissue]
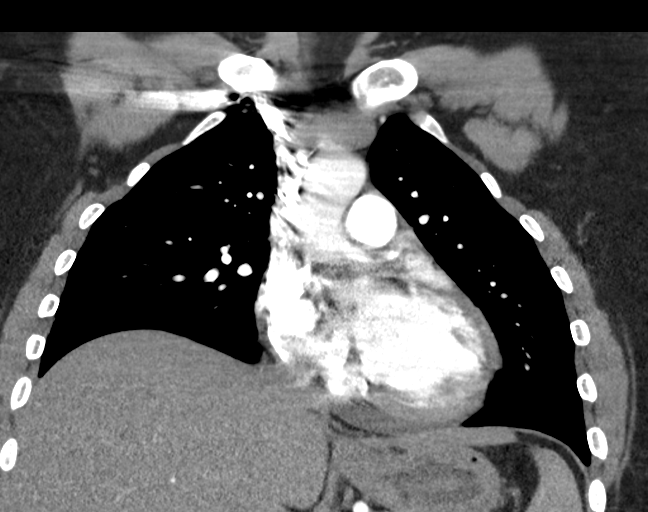
[im 64/85  soft-tissue]
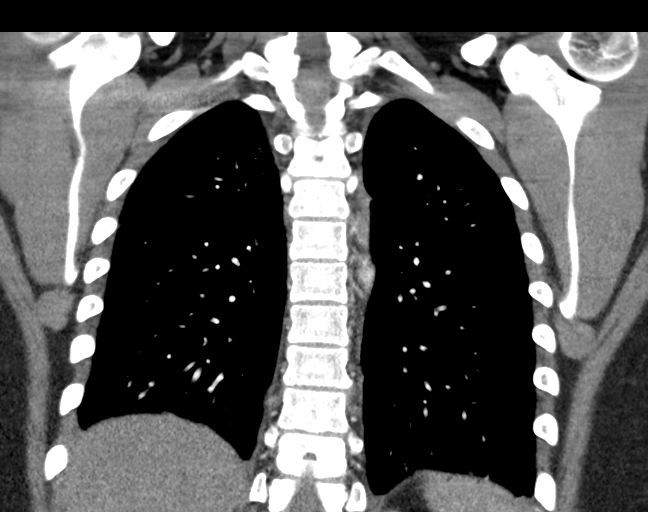

[17 of 46 positions shown; findings below may reference images not displayed]

FINDINGS: Cardiovascular: Contrast opacification of the pulmonary arteries is
very good. No filling defects within either main pulmonary artery or
their segmental branches in either lung to suggest pulmonary
embolism.

Heart size upper normal. No visible coronary atherosclerosis. No
pericardial effusion. No visible atherosclerosis involving the
thoracic or upper abdominal aorta or the proximal great vessels.

Mediastinum/Nodes: No pathologically enlarged mediastinal, hilar or
axillary lymph nodes. No mediastinal masses. Normal-appearing
esophagus. Visualized thyroid gland normal in appearance.

Lungs/Pleura: Pulmonary parenchyma clear apart from minimal
atelectasis involving the deep posterior right lower lobe. No
confluent airspace consolidation. No evidence of interstitial
disease. No pulmonary parenchymal nodules or masses. Very small
right pleural effusion. No left pleural effusion. Central airways
patent with mild bronchial wall thickening.

Upper Abdomen: Small amount of free intraperitoneal air. Visualized
upper abdomen otherwise unremarkable for the early arterial phase of
enhancement.

Musculoskeletal: Regional skeleton intact without acute or
significant osseous abnormality.

Review of the MIP images confirms the above findings.
IMPRESSION: 1. No evidence of pulmonary embolism.
2. Mild changes of bronchitis and/or asthma. Minimal atelectasis
involving the right lower lobe. Very small right pleural effusion.
No acute cardiopulmonary disease otherwise.
3. Small amount of free intraperitoneal air which is felt to be the
residual of the laparoscopic procedure performed 3 days ago.

## 2020-09-14 ENCOUNTER — Ambulatory Visit
Admission: RE | Admit: 2020-09-14 | Discharge: 2020-09-14 | Disposition: A | Discharge status: Home / Self Care | Payer: Medicaid Other | Attending: Family Medicine | Admitting: Family Medicine

## 2020-09-14 ENCOUNTER — Other Ambulatory Visit: Payer: Self-pay | Admitting: Family Medicine

## 2020-09-14 ENCOUNTER — Ambulatory Visit
Admission: RE | Admit: 2020-09-14 | Discharge: 2020-09-14 | Disposition: A | Discharge status: Home / Self Care | Payer: Medicaid Other | Source: Ambulatory Visit | Attending: Family Medicine | Admitting: Family Medicine

## 2020-09-14 ENCOUNTER — Other Ambulatory Visit: Payer: Self-pay

## 2020-09-14 DIAGNOSIS — M5441 Lumbago with sciatica, right side: Secondary | ICD-10-CM | POA: Diagnosis not present

## 2021-10-16 ENCOUNTER — Other Ambulatory Visit: Payer: Self-pay | Admitting: Student

## 2021-10-16 DIAGNOSIS — R519 Headache, unspecified: Secondary | ICD-10-CM

## 2021-10-16 DIAGNOSIS — H53149 Visual discomfort, unspecified: Secondary | ICD-10-CM

## 2021-10-16 DIAGNOSIS — F40298 Other specified phobia: Secondary | ICD-10-CM

## 2021-10-16 DIAGNOSIS — R42 Dizziness and giddiness: Secondary | ICD-10-CM

## 2021-10-31 ENCOUNTER — Ambulatory Visit
Admission: RE | Admit: 2021-10-31 | Discharge: 2021-10-31 | Disposition: A | Discharge status: Home / Self Care | Payer: Medicaid Other | Source: Ambulatory Visit | Attending: Student | Admitting: Student

## 2021-10-31 DIAGNOSIS — F40298 Other specified phobia: Secondary | ICD-10-CM | POA: Insufficient documentation

## 2021-10-31 DIAGNOSIS — H53149 Visual discomfort, unspecified: Secondary | ICD-10-CM | POA: Insufficient documentation

## 2021-10-31 DIAGNOSIS — R519 Headache, unspecified: Secondary | ICD-10-CM | POA: Diagnosis present

## 2021-10-31 DIAGNOSIS — R42 Dizziness and giddiness: Secondary | ICD-10-CM | POA: Insufficient documentation
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
